# Patient Record
Sex: Male | Born: 1980 | Race: Asian | Hispanic: No | Marital: Married | Smoking: Former smoker
Health system: Southern US, Community
[De-identification: ages and names within clinical notes are randomized; demographics above are authoritative.]

## PROBLEM LIST (undated history)

## (undated) DIAGNOSIS — T7840XA Allergy, unspecified, initial encounter: Secondary | ICD-10-CM

## (undated) HISTORY — DX: Allergy, unspecified, initial encounter: T78.40XA

---

## 2020-11-08 ENCOUNTER — Ambulatory Visit: Payer: Self-pay | Admitting: Family Medicine

## 2020-12-16 ENCOUNTER — Other Ambulatory Visit: Payer: Self-pay

## 2020-12-16 ENCOUNTER — Ambulatory Visit (INDEPENDENT_AMBULATORY_CARE_PROVIDER_SITE_OTHER): Payer: Managed Care, Other (non HMO) | Admitting: Family Medicine

## 2020-12-16 ENCOUNTER — Encounter: Payer: Self-pay | Admitting: Family Medicine

## 2020-12-16 VITALS — BP 127/84 | HR 96 | Temp 98.2°F | Ht 65.25 in | Wt 149.0 lb

## 2020-12-16 DIAGNOSIS — J302 Other seasonal allergic rhinitis: Secondary | ICD-10-CM | POA: Diagnosis not present

## 2020-12-16 DIAGNOSIS — Z13220 Encounter for screening for lipoid disorders: Secondary | ICD-10-CM

## 2020-12-16 DIAGNOSIS — L649 Androgenic alopecia, unspecified: Secondary | ICD-10-CM

## 2020-12-16 DIAGNOSIS — Z131 Encounter for screening for diabetes mellitus: Secondary | ICD-10-CM | POA: Diagnosis not present

## 2020-12-16 DIAGNOSIS — Z0001 Encounter for general adult medical examination with abnormal findings: Secondary | ICD-10-CM | POA: Diagnosis not present

## 2020-12-16 NOTE — Patient Instructions (Signed)
It was very nice to see you today!  We will check blood work today.  Let me know if your allergies worsen.  I will see you back in a year or so.  Please come back to see me sooner if needed.  Take care, Dr [name]  Please try these tips to maintain a healthy lifestyle:   Eat at least 3 REAL meals and 1-2 snacks per day.  Aim for no more than 5 hours between eating.  If you eat breakfast, please do so within one hour of getting up.    Each meal should contain half fruits/vegetables, one quarter protein, and one quarter carbs (no bigger than a computer mouse)   Cut down on sweet beverages. This includes juice, soda, and sweet tea.     Drink at least 1 glass of water with each meal and aim for at least 8 glasses per day   Exercise at least 150 minutes every week.    Preventive Care 60-40 Years Old, Male Preventive care refers to lifestyle choices and visits with your health care provider that can promote health and wellness. This includes:  A yearly physical exam. This is also called an annual wellness visit.  Regular dental and eye exams.  Immunizations.  Screening for certain conditions.  Healthy lifestyle choices, such as: ? Eating a healthy diet. ? Getting regular exercise. ? Not using drugs or products that contain nicotine and tobacco. ? Limiting alcohol use. What can I expect for my preventive care visit? Physical exam Your health care provider will check your:  Height and weight. These may be used to calculate your BMI (body mass index). BMI is a measurement that tells if you are at a healthy weight.  Heart rate and blood pressure.  Body temperature.  Skin for abnormal spots. Counseling Your health care provider may ask you questions about your:  Past medical problems.  Family's medical history.  Alcohol, tobacco, and drug use.  Emotional well-being.  Home life and relationship well-being.  Sexual activity.  Diet, exercise, and sleep  habits.  Work and work Astronomer.  Access to firearms. What immunizations do I need? Vaccines are usually given at various ages, according to a schedule. Your health care provider will recommend vaccines for you based on your age, medical history, and lifestyle or other factors, such as travel or where you work.   What tests do I need? Blood tests  Lipid and cholesterol levels. These may be checked every 5 years, or more often if you are over 82 years old.  Hepatitis C test.  Hepatitis B test. Screening  Lung cancer screening. You may have this screening every year starting at age 64 if you have a 30-pack-year history of smoking and currently smoke or have quit within the past 15 years.  Prostate cancer screening. Recommendations will vary depending on your family history and other risks.  Genital exam to check for testicular cancer or hernias.  Colorectal cancer screening. ? All adults should have this screening starting at age 60 and continuing until age 11. ? Your health care provider may recommend screening at age 103 if you are at increased risk. ? You will have tests every 1-10 years, depending on your results and the type of screening test.  Diabetes screening. ? This is done by checking your blood sugar (glucose) after you have not eaten for a while (fasting). ? You may have this done every 1-3 years.  STD (sexually transmitted disease) testing, if you are  at risk. Follow these instructions at home: Eating and drinking  Eat a diet that includes fresh fruits and vegetables, whole grains, lean protein, and low-fat dairy products.  Take vitamin and mineral supplements as recommended by your health care provider.  Do not drink alcohol if your health care provider tells you not to drink.  If you drink alcohol: ? Limit how much you have to 0-2 drinks a day. ? Be aware of how much alcohol is in your drink. In the [place] one drink equals one 12 oz bottle of beer (355 mL),  one 5 oz glass of wine (148 mL), or one 1 oz glass of hard liquor (44 mL).   Lifestyle  Take daily care of your teeth and gums. Brush your teeth every morning and night with fluoride toothpaste. Floss one time each day.  Stay active. Exercise for at least 30 minutes 5 or more days each week.  Do not use any products that contain nicotine or tobacco, such as cigarettes, e-cigarettes, and chewing tobacco. If you need help quitting, ask your health care provider.  Do not use drugs.  If you are sexually active, practice safe sex. Use a condom or other form of protection to prevent STIs (sexually transmitted infections).  If told by your health care provider, take low-dose aspirin daily starting at age 23.  Find healthy ways to cope with stress, such as: ? Meditation, yoga, or listening to music. ? Journaling. ? Talking to a trusted person. ? Spending time with friends and family. Safety  Always wear your seat belt while driving or riding in a vehicle.  Do not drive: ? If you have been drinking alcohol. Do not ride with someone who has been drinking. ? When you are tired or distracted. ? While texting.  Wear a helmet and other protective equipment during sports activities.  If you have firearms in your house, make sure you follow all gun safety procedures. What's next?  Go to your health care provider once a year for an annual wellness visit.  Ask your health care provider how often you should have your eyes and teeth checked.  Stay up to date on all vaccines. This information is not intended to replace advice given to you by your health care provider. Make sure you discuss any questions you have with your health care provider. Document Revised: 07/18/2019 Document Reviewed: 10/13/2018 Elsevier Patient Education  2021 [employer].

## 2020-12-16 NOTE — Assessment & Plan Note (Signed)
Stable Continue claritin as needed.

## 2020-12-16 NOTE — Assessment & Plan Note (Signed)
Can use OTC minoxidil as needed. Discussed finasteride however he deferred at this time.

## 2020-12-16 NOTE — Progress Notes (Signed)
Chief Complaint:  David Wolf is a 40 y.o. male who presents today for his annual comprehensive physical exam.  He is a new patient.   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 Discussed finasteride however he deferred at this time.   Seasonal allergies Stable Continue claritin as needed.   Preventative Healthcare: Check lipids and glucose.  Up-to-date on vaccines.  Patient Counseling(The following topics were reviewed and/or handout was given):  -Nutrition: Stressed importance of moderation in sodium/caffeine intake, saturated fat and cholesterol, caloric balance, sufficient intake of fresh fruits, vegetables, and fiber.  -Stressed the importance of regular exercise.   -Substance Abuse: Discussed cessation/primary prevention of tobacco, alcohol, or other drug use; driving or other dangerous activities under the influence; availability of treatment for abuse.   -Injury prevention: Discussed safety belts, safety helmets, smoke detector, smoking near bedding or upholstery.   -Sexuality: Discussed sexually transmitted diseases, partner selection, use of condoms, avoidance of unintended pregnancy and contraceptive alternatives.   -Dental health: Discussed importance of regular tooth brushing, flossing, and dental visits.  -Health maintenance and immunizations reviewed. Please refer to Health maintenance section.  Return to care in 1 year for next preventative visit.     Subjective:  HPI:  He has no acute complaints today.   Lifestyle Diet: Balanced.  Exercise: Yoga.   Depression screen PHQ 2/9 12/16/2020  Decreased Interest 0  Down, Depressed, Hopeless 0  PHQ - 2 Score 0    Health Maintenance Due  Topic Date Due  . Hepatitis C Screening  Never done  . HIV Screening  Never done  . TETANUS/TDAP  Never done     ROS: Per HPI, otherwise a complete review of systems was negative.   PMH:  The following  were reviewed and entered/updated in epic: Past Medical History:  Diagnosis Date  . Allergy    Patient Active Problem List   Diagnosis Date Noted  . Seasonal allergies 12/16/2020  . 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 No pertinent surgical history.  Family History  Problem Relation Age of Onset  . Hypertension Father   . Diabetes Maternal Grandmother   . Hypertension Paternal Grandmother     Medications- reviewed and updated Current Outpatient Medications  Medication Sig Dispense Refill  . Loratadine (CLARITIN PO) Take by mouth.     No current facility-administered medications for this visit.    Allergies-reviewed and updated No Known Allergies  Social History   Socioeconomic History  . Marital status: Not on file    Spouse name: Not on file  . Number of children: Not on file  . Years of education: Not on file  . Highest education level: Not on file  Occupational History  . Not on file  Tobacco Use  . Smoking status: Former Games developer  . Smokeless tobacco: Not on file  Substance and Sexual Activity  . Alcohol use: Yes    Comment: occ  . Drug use: Never  . Sexual activity: Not on file  Other Topics Concern  . Not on file  Social History Narrative  . Not on file   Social Determinants of Health   Financial Resource Strain: Not on file  Food Insecurity: Not on file  Transportation Needs: Not on file  Physical Activity: Not on file  Stress: Not on file  Social Connections: Not on file        Objective:  Physical Exam: BP 127/84   Pulse 96   Temp  98.2 F (36.8 C) (Temporal)   Ht 5' 5.25" (1.657 m)   Wt 149 lb (67.6 kg)   SpO2 98%   BMI 24.61 kg/m   Body mass index is 24.61 kg/m. Wt Readings from Last 3 Encounters:  12/16/20 149 lb (67.6 kg)   Gen: NAD, resting comfortably HEENT: TMs normal bilaterally. OP clear. No thyromegaly noted.  CV: RRR with no murmurs appreciated Pulm: NWOB, CTAB with no crackles, wheezes, or rhonchi GI:  Normal bowel sounds present. Soft, Nontender, Nondistended. MSK: no edema, cyanosis, or clubbing noted Skin: warm, dry Neuro: CN2-12 grossly intact. Strength 5/5 in upper and lower extremities. Reflexes symmetric and intact bilaterally.  Psych: Normal affect and thought content     Shavonda Wiedman M. [name], MD 12/16/2020 2:53 PM

## 2020-12-17 ENCOUNTER — Encounter: Payer: Self-pay | Admitting: Family Medicine

## 2020-12-17 DIAGNOSIS — R739 Hyperglycemia, unspecified: Secondary | ICD-10-CM | POA: Insufficient documentation

## 2020-12-17 DIAGNOSIS — E785 Hyperlipidemia, unspecified: Secondary | ICD-10-CM | POA: Insufficient documentation

## 2020-12-17 LAB — LIPID PANEL
Cholesterol: 235 mg/dL — ABNORMAL HIGH (ref 0–200)
HDL: 37.4 mg/dL — ABNORMAL LOW (ref >39.00)
NonHDL: 197.44
Total CHOL/HDL Ratio: 6
Triglycerides: 353 mg/dL — ABNORMAL HIGH (ref 0.0–149.0)
VLDL: 70.6 mg/dL — ABNORMAL HIGH (ref 0.0–40.0)

## 2020-12-17 LAB — LDL CHOLESTEROL, DIRECT: Direct LDL: 176 mg/dL

## 2020-12-17 LAB — GLUCOSE, RANDOM: Glucose, Bld: 113 mg/dL — ABNORMAL HIGH (ref 70–99)

## 2020-12-17 NOTE — Progress Notes (Signed)
Please inform patient of the following:  Cholesterol and blood sugar are high. Do not need to start medications but he should work on diet and exercise and we can recheck in a year or so.  David Wolf. [name], MD 12/17/2020 12:57 PM

## 2021-05-08 ENCOUNTER — Ambulatory Visit (HOSPITAL_BASED_OUTPATIENT_CLINIC_OR_DEPARTMENT_OTHER)
Admission: RE | Admit: 2021-05-08 | Discharge: 2021-05-08 | Disposition: A | Discharge status: Home / Self Care | Payer: BC Managed Care – PPO | Source: Ambulatory Visit | Attending: Family | Admitting: Family

## 2021-05-08 ENCOUNTER — Ambulatory Visit (INDEPENDENT_AMBULATORY_CARE_PROVIDER_SITE_OTHER): Payer: BC Managed Care – PPO | Admitting: Family

## 2021-05-08 ENCOUNTER — Encounter: Payer: Self-pay | Admitting: Family

## 2021-05-08 ENCOUNTER — Other Ambulatory Visit: Payer: Self-pay

## 2021-05-08 VITALS — BP 118/70 | HR 85 | Temp 97.6°F | Ht 65.25 in | Wt 156.0 lb

## 2021-05-08 DIAGNOSIS — K2100 Gastro-esophageal reflux disease with esophagitis, without bleeding: Secondary | ICD-10-CM

## 2021-05-08 DIAGNOSIS — M25512 Pain in left shoulder: Secondary | ICD-10-CM | POA: Insufficient documentation

## 2021-05-08 DIAGNOSIS — L239 Allergic contact dermatitis, unspecified cause: Secondary | ICD-10-CM

## 2021-05-08 MED ORDER — TRIAMCINOLONE ACETONIDE 0.1 % EX CREA: 1.0000 "application " | TOPICAL_CREAM | Freq: Two times a day (BID) | CUTANEOUS | 0 refills | Status: AC

## 2021-05-08 MED ORDER — OMEPRAZOLE 20 MG PO CPDR: 20.0000 mg | DELAYED_RELEASE_CAPSULE | Freq: Every day | ORAL | 3 refills | Status: DC

## 2021-05-08 NOTE — Progress Notes (Signed)
Acute Office Visit  Subjective:    Patient ID: David Wolf, male    DOB: [birth year], 40 y.o.   MRN: [id number]  Chief Complaint  Patient presents with  . Rash    Pt c/o rash on right forearm area and spreading some on left forearm too, itching used cere vae cream.  . Shoulder Pain    Pt c/o left shoulder pain x 1 month, states old injury 2016. Pain is getting worse. Using Advil little relief.  . Gastroesophageal Reflux    Pt c/o acid reflux x 2 weeks, using Pep acid with relief.     HPI Patient is in today with complaints of a rash to his right forearm has been present about 1 week.  Reports taking a dose of Claritin has not helped much.  Denies any changes in detergents, soaps, or lotions.  No new environmental changes that he is aware of.  Patient reports left shoulder pain that has been ongoing x1 month.  Reports he had an old injury in 2016 where he lifted his nephew and the shoulder has always had some discomfort.  More recently, he has had pain that it typically is worse at night when he is laying on his left shoulder.  Has taken Advil once that seems to make reflux worse.  The pain is a 6 out of 10 and comes and goes.  Patient has concerns of heartburn and indigestion that has been going on the last 2 to 3 weeks.  He has been taking Pepcid that helps some.  He has also asked his mom to not make the food as spicy.  He typically consumes an [place] diet that tends to be higher in spice.  Reports having indigestion that tends to be worse at night.  He has an acidic taste in his mouth at times.  Denies any nausea vomiting, or blood in his stools.  Past Medical History:  Diagnosis Date  . Allergy     History reviewed. No pertinent surgical history.  Family History  Problem Relation Age of Onset  . Hypertension Father   . Diabetes Maternal Grandmother   . Hypertension Paternal Grandmother     Social History   Socioeconomic History  . Marital status: Married     Spouse name: Not on file  . Number of children: Not on file  . Years of education: Not on file  . Highest education level: Not on file  Occupational History  . Not on file  Tobacco Use  . Smoking status: Former    Pack years: 0.00  . Smokeless tobacco: Never  Substance and Sexual Activity  . Alcohol use: Yes    Comment: occ  . Drug use: Never  . Sexual activity: Not on file  Other Topics Concern  . Not on file  Social History Narrative  . Not on file   Social Determinants of Health   Financial Resource Strain: Not on file  Food Insecurity: Not on file  Transportation Needs: Not on file  Physical Activity: Not on file  Stress: Not on file  Social Connections: Not on file  Intimate Partner Violence: Not on file    Outpatient Medications Prior to Visit  Medication Sig Dispense Refill  . Loratadine (CLARITIN PO) Take by mouth.     No facility-administered medications prior to visit.    No Known Allergies  Review of Systems  Constitutional: Negative.   HENT: Negative.    Respiratory: Negative.    Cardiovascular: Negative.  Gastrointestinal:  Negative for blood in stool, constipation and diarrhea.       Heartburn and indigestion that is worse at night  Endocrine: Negative.   Musculoskeletal:  Positive for arthralgias.       Left shoulder pain  Skin:  Positive for rash.       Rash to the right forearm and mild rash to the left forearm  Allergic/Immunologic: Negative.   Neurological: Negative.  Negative for weakness and numbness.  Hematological: Negative.   Psychiatric/Behavioral: Negative.    All other systems reviewed and are negative.     Objective:    Physical Exam Vitals and nursing note reviewed.  Constitutional:      Appearance: Normal appearance. He is normal weight.  HENT:     Mouth/Throat:     Mouth: Mucous membranes are moist.  Cardiovascular:     Rate and Rhythm: Normal rate and regular rhythm.     Pulses: Normal pulses.     Heart sounds:  Normal heart sounds.  Pulmonary:     Effort: Pulmonary effort is normal.     Breath sounds: Normal breath sounds.  Abdominal:     General: Abdomen is flat. Bowel sounds are normal.     Palpations: Abdomen is soft.     Tenderness: There is no abdominal tenderness. There is no guarding or rebound.  Musculoskeletal:        General: Normal range of motion.     Right shoulder: Normal.     Left shoulder: No swelling, deformity or tenderness. Normal range of motion. Normal strength.       Arms:     Cervical back: Normal range of motion and neck supple.  Skin:    General: Skin is warm and dry.     Findings: Rash present.     Comments: Rash to the right and left forearm is a fine, papular, mildly excoriated rash.  Neurological:     General: No focal deficit present.     Mental Status: He is alert and oriented to person, place, and time.  Psychiatric:        Mood and Affect: Mood normal.        Behavior: Behavior normal.   BP 118/70 (BP Location: Left Arm, Patient Position: Sitting, Cuff Size: Normal)   Pulse 85   Temp 97.6 F (36.4 C) (Temporal)   Ht 5' 5.25" (1.657 m)   Wt 156 lb (70.8 kg)   SpO2 97%   BMI 25.76 kg/m  Wt Readings from Last 3 Encounters:  05/08/21 156 lb (70.8 kg)  12/16/20 149 lb (67.6 kg)    Health Maintenance Due  Topic Date Due  . HIV Screening  Never done  . Hepatitis C Screening  Never done  . TETANUS/TDAP  Never done  . COVID-19 Vaccine (3 - Booster for Pfizer series) 01/11/2021    There are no preventive care reminders to display for this patient.   No results found for: TSH No results found for: WBC, HGB, HCT, MCV, PLT Lab Results  Component Value Date   GLUCOSE 113 (H) 12/16/2020   Lab Results  Component Value Date   CHOL 235 (H) 12/16/2020   Lab Results  Component Value Date   HDL 37.40 (L) 12/16/2020   No results found for: [facility] Lab Results  Component Value Date   TRIG 353.0 (H) 12/16/2020   Lab Results  Component Value  Date   CHOLHDL 6 12/16/2020   No results found for: HGBA1C  Assessment & Plan:   Problem List Items Addressed This Visit   None Visit Diagnoses     Gastroesophageal reflux disease with esophagitis without hemorrhage    -  Primary   Allergic contact dermatitis, unspecified trigger       Acute pain of left shoulder       Relevant Orders   DG Shoulder Left        Meds ordered this encounter  Medications  . omeprazole (PRILOSEC) 20 MG capsule    Sig: Take 1 capsule (20 mg total) by mouth daily.    Dispense:  30 capsule    Refill:  3  . triamcinolone cream (KENALOG) 0.1 %    Sig: Apply 1 application topically 2 (two) times daily.    Dispense:  30 g    Refill:  0     Eulis Foster, FNP

## 2021-05-19 ENCOUNTER — Telehealth: Payer: Self-pay | Admitting: *Deleted

## 2021-05-19 NOTE — Telephone Encounter (Signed)
Pt called and said x-ray shows arthritis but wants to know what he is suppose to do for Left shoulder pain medication, exercises or what? Told him I will send message to [facility] and get back to him. Pt verbalized understanding.

## 2021-05-19 NOTE — Telephone Encounter (Signed)
David Wolf, pt wants to know what he is suppose to do for arthritis of left shoulder? Medication, exercises, therapy? Please advise.

## 2021-05-20 NOTE — Telephone Encounter (Signed)
Spoke to pt told him per [facility], may take anti-inflammatory medications as needed (Ibuprofen, Advil, Aleve). Strengthening exercises to strengthen the muscles around the shoulder, warm compresses. If symptoms persist, we can refer to ortho. Pt verbalized understanding.

## 2021-09-24 ENCOUNTER — Ambulatory Visit (INDEPENDENT_AMBULATORY_CARE_PROVIDER_SITE_OTHER): Payer: BC Managed Care – PPO

## 2021-09-24 ENCOUNTER — Other Ambulatory Visit: Payer: Self-pay

## 2021-09-24 DIAGNOSIS — Z23 Encounter for immunization: Secondary | ICD-10-CM | POA: Diagnosis not present

## 2021-10-04 ENCOUNTER — Other Ambulatory Visit: Payer: Self-pay | Admitting: Family

## 2022-05-22 ENCOUNTER — Encounter: Payer: Self-pay | Admitting: Family Medicine

## 2022-05-22 ENCOUNTER — Ambulatory Visit (INDEPENDENT_AMBULATORY_CARE_PROVIDER_SITE_OTHER): Payer: BC Managed Care – PPO | Admitting: Family Medicine

## 2022-05-22 VITALS — BP 129/87 | HR 80 | Temp 98.0°F | Ht 65.25 in | Wt 156.6 lb

## 2022-05-22 DIAGNOSIS — E663 Overweight: Secondary | ICD-10-CM | POA: Diagnosis not present

## 2022-05-22 DIAGNOSIS — Z0001 Encounter for general adult medical examination with abnormal findings: Secondary | ICD-10-CM

## 2022-05-22 DIAGNOSIS — E785 Hyperlipidemia, unspecified: Secondary | ICD-10-CM | POA: Diagnosis not present

## 2022-05-22 DIAGNOSIS — R739 Hyperglycemia, unspecified: Secondary | ICD-10-CM | POA: Diagnosis not present

## 2022-05-22 LAB — COMPREHENSIVE METABOLIC PANEL
ALT: 44 U/L (ref 0–53)
AST: 23 U/L (ref 0–37)
Albumin: 4.5 g/dL (ref 3.5–5.2)
Alkaline Phosphatase: 68 U/L (ref 39–117)
BUN: 9 mg/dL (ref 6–23)
CO2: 28 mEq/L (ref 19–32)
Calcium: 9.2 mg/dL (ref 8.4–10.5)
Chloride: 103 mEq/L (ref 96–112)
Creatinine, Ser: 0.78 mg/dL (ref 0.40–1.50)
GFR: 109.96 mL/min (ref >60.00)
Glucose, Bld: 153 mg/dL — ABNORMAL HIGH (ref 70–99)
Potassium: 4.2 mEq/L (ref 3.5–5.1)
Sodium: 139 mEq/L (ref 135–145)
Total Bilirubin: 0.5 mg/dL (ref 0.2–1.2)
Total Protein: 6.8 g/dL (ref 6.0–8.3)

## 2022-05-22 LAB — LIPID PANEL
Cholesterol: 232 mg/dL — ABNORMAL HIGH (ref 0–200)
HDL: 39 mg/dL — ABNORMAL LOW (ref >39.00)
LDL Cholesterol: 157 mg/dL — ABNORMAL HIGH (ref 0–99)
NonHDL: 193.11
Total CHOL/HDL Ratio: 6
Triglycerides: 183 mg/dL — ABNORMAL HIGH (ref 0.0–149.0)
VLDL: 36.6 mg/dL (ref 0.0–40.0)

## 2022-05-22 LAB — HEMOGLOBIN A1C: Hgb A1c MFr Bld: 7.8 % — ABNORMAL HIGH (ref 4.6–6.5)

## 2022-05-22 LAB — CBC
HCT: 41.9 % (ref 39.0–52.0)
Hemoglobin: 14.1 g/dL (ref 13.0–17.0)
MCHC: 33.7 g/dL (ref 30.0–36.0)
MCV: 80.4 fl (ref 78.0–100.0)
Platelets: 237 10*3/uL (ref 150.0–400.0)
RBC: 5.21 Mil/uL (ref 4.22–5.81)
RDW: 12.6 % (ref 11.5–15.5)
WBC: 5.9 10*3/uL (ref 4.0–10.5)

## 2022-05-22 LAB — TSH: TSH: 2.15 u[IU]/mL (ref 0.35–5.50)

## 2022-05-22 NOTE — Patient Instructions (Signed)
It was very nice to see you today!  We will check blood work today.  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  You can try using compression sleeves.  You can also use Tylenol as needed.  You will be due for your next physical in 1 year.  Take care, Dr [name]  PLEASE NOTE:  If you had any lab tests please let us know if you have not heard back within a few days. You may see your results on mychart before we have a chance to review them but we will give you a call once they are reviewed by [place]. If we ordered any referrals today, please let us know if you have not heard from their office within the next week.   Please try these tips to maintain a healthy lifestyle:  Eat at least 3 REAL meals and 1-2 snacks per day.  Aim for no more than 5 hours between eating.  If you eat breakfast, please do so within one hour of getting up.   Each meal should contain half fruits/vegetables, one quarter protein, and one quarter carbs (no bigger than a computer mouse)  Cut down on sweet beverages. This includes juice, soda, and sweet tea.   Drink at least 1 glass of water with each meal and aim for at least 8 glasses per day  Exercise at least 150 minutes every week.    Preventive Care 49-91 Years Old, Male Preventive care refers to lifestyle choices and visits with your health care provider that can promote health and wellness. Preventive care visits are also called wellness exams. What can I expect for my preventive care visit? Counseling During your preventive care visit, your health care provider may ask about your: Medical history, including: Past medical problems. Family medical history. Current health, including: Emotional well-being. Home life and relationship well-being. Sexual activity. Lifestyle, including: Alcohol, nicotine or tobacco, and drug use. Access to firearms. Diet, exercise, and sleep habits. Safety issues such as seatbelt and bike helmet use. Sunscreen  use. Work and work Astronomer. Physical exam Your health care provider will check your: Height and weight. These may be used to calculate your BMI (body mass index). BMI is a measurement that tells if you are at a healthy weight. Waist circumference. This measures the distance around your waistline. This measurement also tells if you are at a healthy weight and may help predict your risk of certain diseases, such as type 2 diabetes and high blood pressure. Heart rate and blood pressure. Body temperature. Skin for abnormal spots. What immunizations do I need?  Vaccines are usually given at various ages, according to a schedule. Your health care provider will recommend vaccines for you based on your age, medical history, and lifestyle or other factors, such as travel or where you work. What tests do I need? Screening Your health care provider may recommend screening tests for certain conditions. This may include: Lipid and cholesterol levels. Diabetes screening. This is done by checking your blood sugar (glucose) after you have not eaten for a while (fasting). Hepatitis B test. Hepatitis C test. HIV (human immunodeficiency virus) test. STI (sexually transmitted infection) testing, if you are at risk. Lung cancer screening. Prostate cancer screening. Colorectal cancer screening. Talk with your health care provider about your test results, treatment options, and if necessary, the need for more tests. Follow these instructions at home: Eating and drinking  Eat a diet that includes fresh fruits and vegetables, whole grains, lean  protein, and low-fat dairy products. Take vitamin and mineral supplements as recommended by your health care provider. Do not drink alcohol if your health care provider tells you not to drink. If you drink alcohol: Limit how much you have to 0-2 drinks a day. Know how much alcohol is in your drink. In the [place] one drink equals one 12 oz bottle of beer (355 mL),  one 5 oz glass of wine (148 mL), or one 1 oz glass of hard liquor (44 mL). Lifestyle Brush your teeth every morning and night with fluoride toothpaste. Floss one time each day. Exercise for at least 30 minutes 5 or more days each week. Do not use any products that contain nicotine or tobacco. These products include cigarettes, chewing tobacco, and vaping devices, such as e-cigarettes. If you need help quitting, ask your health care provider. Do not use drugs. If you are sexually active, practice safe sex. Use a condom or other form of protection to prevent STIs. Take aspirin only as told by your health care provider. Make sure that you understand how much to take and what form to take. Work with your health care provider to find out whether it is safe and beneficial for you to take aspirin daily. Find healthy ways to manage stress, such as: Meditation, yoga, or listening to music. Journaling. Talking to a trusted person. Spending time with friends and family. Minimize exposure to UV radiation to reduce your risk of skin cancer. Safety Always wear your seat belt while driving or riding in a vehicle. Do not drive: If you have been drinking alcohol. Do not ride with someone who has been drinking. When you are tired or distracted. While texting. If you have been using any mind-altering substances or drugs. Wear a helmet and other protective equipment during sports activities. If you have firearms in your house, make sure you follow all gun safety procedures. What's next? Go to your health care provider once a year for an annual wellness visit. Ask your health care provider how often you should have your eyes and teeth checked. Stay up to date on all vaccines. This information is not intended to replace advice given to you by your health care provider. Make sure you discuss any questions you have with your health care provider. Document Revised: 04/16/2021 Document Reviewed:  04/16/2021 Elsevier Patient Education  [employer].

## 2022-05-22 NOTE — Assessment & Plan Note (Signed)
Discussed lifestyle modifications.  We will check labs including lipids today.

## 2022-05-22 NOTE — Assessment & Plan Note (Signed)
Discussed lifestyle modifications. Check A1c.  

## 2022-05-22 NOTE — Progress Notes (Signed)
Chief Complaint:  David Wolf is a 41 y.o. male who presents today for his annual comprehensive physical exam.    Assessment/Plan:  New/Acute Problems: Knee Pain No red flags.  Likely secondary to mild underlying osteoarthritis.  We discussed conservative management including over-the-counter medications and compression.  Chronic Problems Addressed Today: Hyperglycemia Discussed lifestyle modifications.  Check A1c  Dyslipidemia Discussed lifestyle modifications.  We will check labs including lipids today.   Body mass index is 25.86 kg/m. / Overweight  BMI Metric Follow Up - 05/22/22 0918       BMI Metric Follow Up-Please document annually   BMI Metric Follow Up Education provided             Preventative Healthcare: Check labs today.  Due for colonoscopy at age 69.  Patient Counseling(The following topics were reviewed and/or handout was given):  -Nutrition: Stressed importance of moderation in sodium/caffeine intake, saturated fat and cholesterol, caloric balance, sufficient intake of fresh fruits, vegetables, and fiber.  -Stressed the importance of regular exercise.   -Substance Abuse: Discussed cessation/primary prevention of tobacco, alcohol, or other drug use; driving or other dangerous activities under the influence; availability of treatment for abuse.   -Injury prevention: Discussed safety belts, safety helmets, smoke detector, smoking near bedding or upholstery.   -Sexuality: Discussed sexually transmitted diseases, partner selection, use of condoms, avoidance of unintended pregnancy and contraceptive alternatives.   -Dental health: Discussed importance of regular tooth brushing, flossing, and dental visits.  -Health maintenance and immunizations reviewed. Please refer to Health maintenance section.  Return to care in 1 year for next preventative visit.     Subjective:  HPI:  See A/P for status of chronic conditions.  He has been having  occasional knee pain.  This happens very infrequently when going up or down stairs.  He has been playing tennis which she thinks may have made his symptoms worse.  No specific treatment tried.  No injuries.  No other obvious precipitating events.  Lifestyle Diet: Cutting down on carbs. Trying to get more fruits and vegetables.  Exercise: Will be moving soon and is busy with packing. Walks about 30 minutes per day.      05/22/2022    8:42 AM  Depression screen PHQ 2/9  Decreased Interest 0  Down, Depressed, Hopeless 0  PHQ - 2 Score 0    Health Maintenance Due  Topic Date Due   HIV Screening  Never done   Hepatitis C Screening  Never done   COVID-19 Vaccine (3 - Pfizer series) 10/08/2020     ROS: Per HPI, otherwise a complete review of systems was negative.   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 No pertinent surgical history.  Family History  Problem Relation Age of Onset   Hypertension Father    Diabetes Maternal Grandmother    Hypertension Paternal Grandmother     Medications- reviewed and updated Current Outpatient Medications  Medication Sig Dispense Refill   Loratadine (CLARITIN PO) Take by mouth.     triamcinolone cream (KENALOG) 0.1 % Apply 1 application topically 2 (two) times daily. 30 g 0   No current facility-administered medications for this visit.    Allergies-reviewed and updated No Known Allergies  Social History  Socioeconomic History   Marital status: Married    Spouse name: Not on file   Number of children: Not on file   Years of education: Not on file   Highest education level: Not on file  Occupational History   Not on file  Tobacco Use   Smoking status: Former    Smokeless tobacco: Never  Substance and Sexual Activity   Alcohol use: Yes    Comment: occ   Drug use: Never   Sexual activity: Not on file  Other Topics Concern   Not on file  Social History Narrative   Not on file   Social Determinants of Health   Financial Resource Strain: Not on file  Food Insecurity: Not on file  Transportation Needs: Not on file  Physical Activity: Not on file  Stress: Not on file  Social Connections: Not on file        Objective:  Physical Exam: BP 129/87   Pulse 80   Temp 98 F (36.7 C) (Temporal)   Ht 5' 5.25" (1.657 m)   Wt 156 lb 9.6 oz (71 kg)   SpO2 98%   BMI 25.86 kg/m   Body mass index is 25.86 kg/m. Wt Readings from Last 3 Encounters:  05/22/22 156 lb 9.6 oz (71 kg)  05/08/21 156 lb (70.8 kg)  12/16/20 149 lb (67.6 kg)   Gen: NAD, resting comfortably HEENT: TMs normal bilaterally. OP clear. No thyromegaly noted.  CV: RRR with no murmurs appreciated Pulm: NWOB, CTAB with no crackles, wheezes, or rhonchi GI: Normal bowel sounds present. Soft, Nontender, Nondistended. MSK: no edema, cyanosis, or clubbing noted - Knee: No deformities or effusions.  Crepitus with active and passive range of motion.  Neurovascular intact distally Skin: warm, dry Neuro: CN2-12 grossly intact. Strength 5/5 in upper and lower extremities. Reflexes symmetric and intact bilaterally.  Psych: Normal affect and thought content     Lalena Salas M. [name], MD 05/22/2022 9:19 AM

## 2022-05-26 NOTE — Progress Notes (Signed)
Please inform patient of the following:  His A1c is significantly elevated and it is in the diabetic range.  We need to work on getting this lower to decrease his future risk of heart attack and stroke.  Recommend starting metformin XR 500 mg daily.  Please send information if he is agreeable to start.  He should continue to work on diet and exercise.  I would like to see him back in 3 months to recheck his A1c.  His cholesterol levels are also elevated.  We can discuss cholesterol medication when he comes back in for his  office visit.  He should continue to work on diet and exercise as above.  Everything else is normal.

## 2022-07-27 ENCOUNTER — Encounter: Payer: Self-pay | Admitting: *Deleted

## 2022-10-15 ENCOUNTER — Encounter: Payer: Self-pay | Admitting: *Deleted

## 2022-12-22 IMAGING — DX DG SHOULDER 2+V*L*
3 series · 3 of 3 positions shown · non-contrast
Comparison: None.

CLINICAL DATA: 40-year-old male with trauma to the left shoulder.

EXAM:
LEFT SHOULDER - 2+ VIEW

[shoulder grashey]
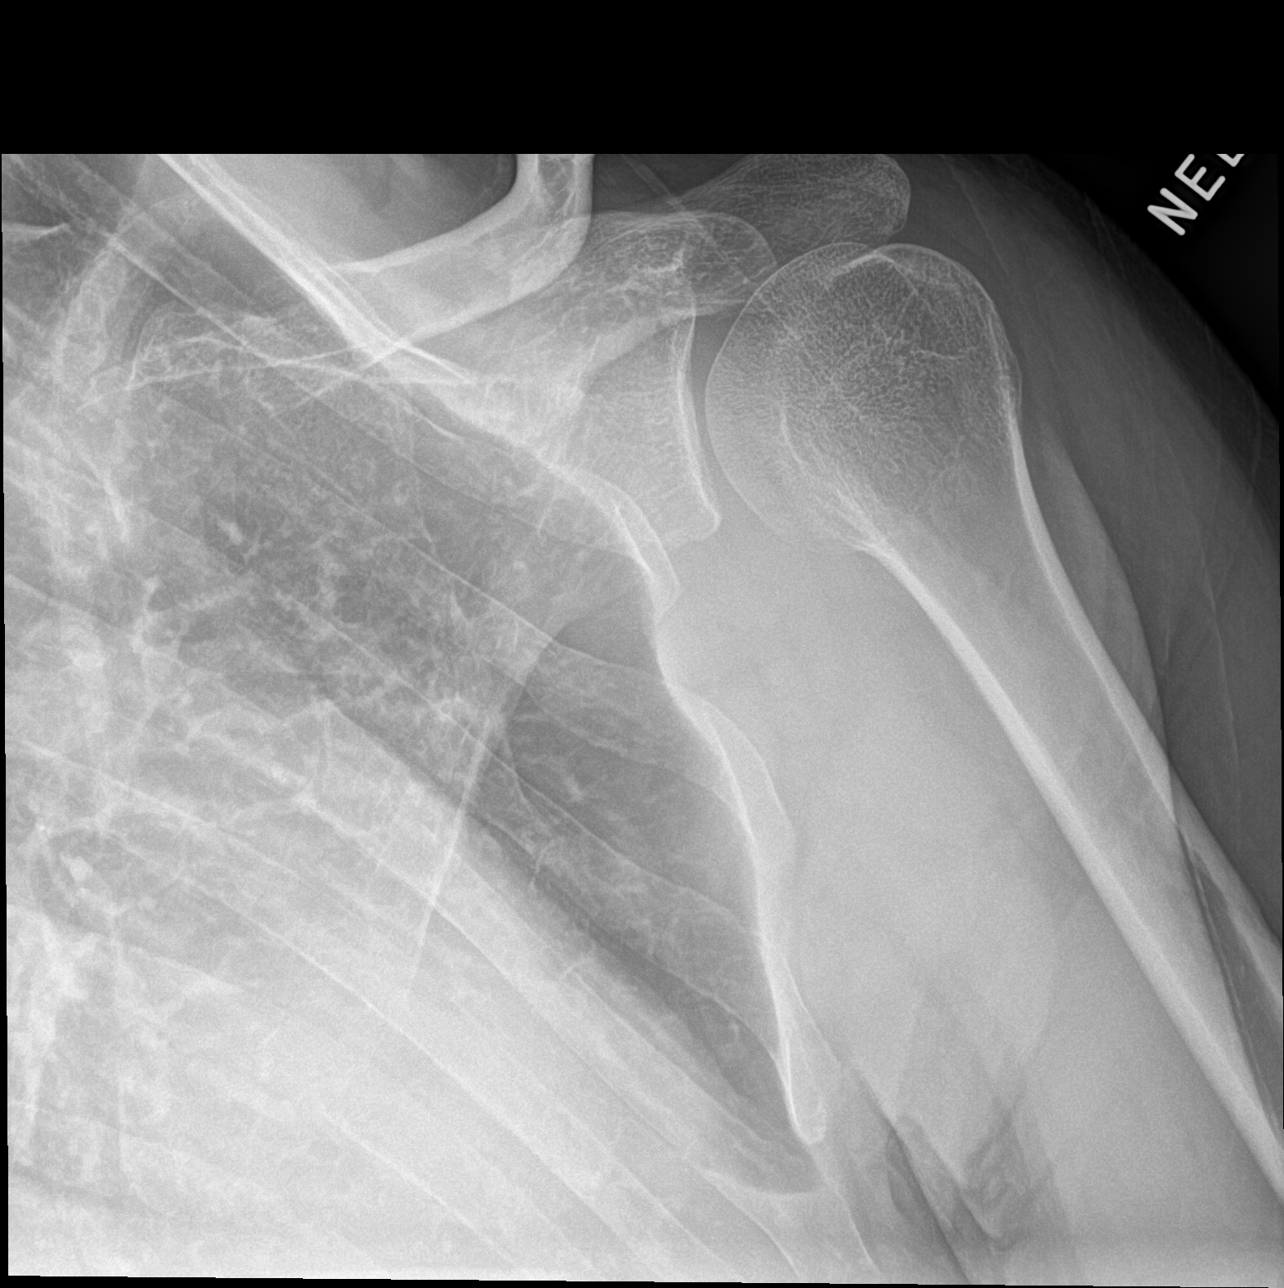

[shoulder y view]
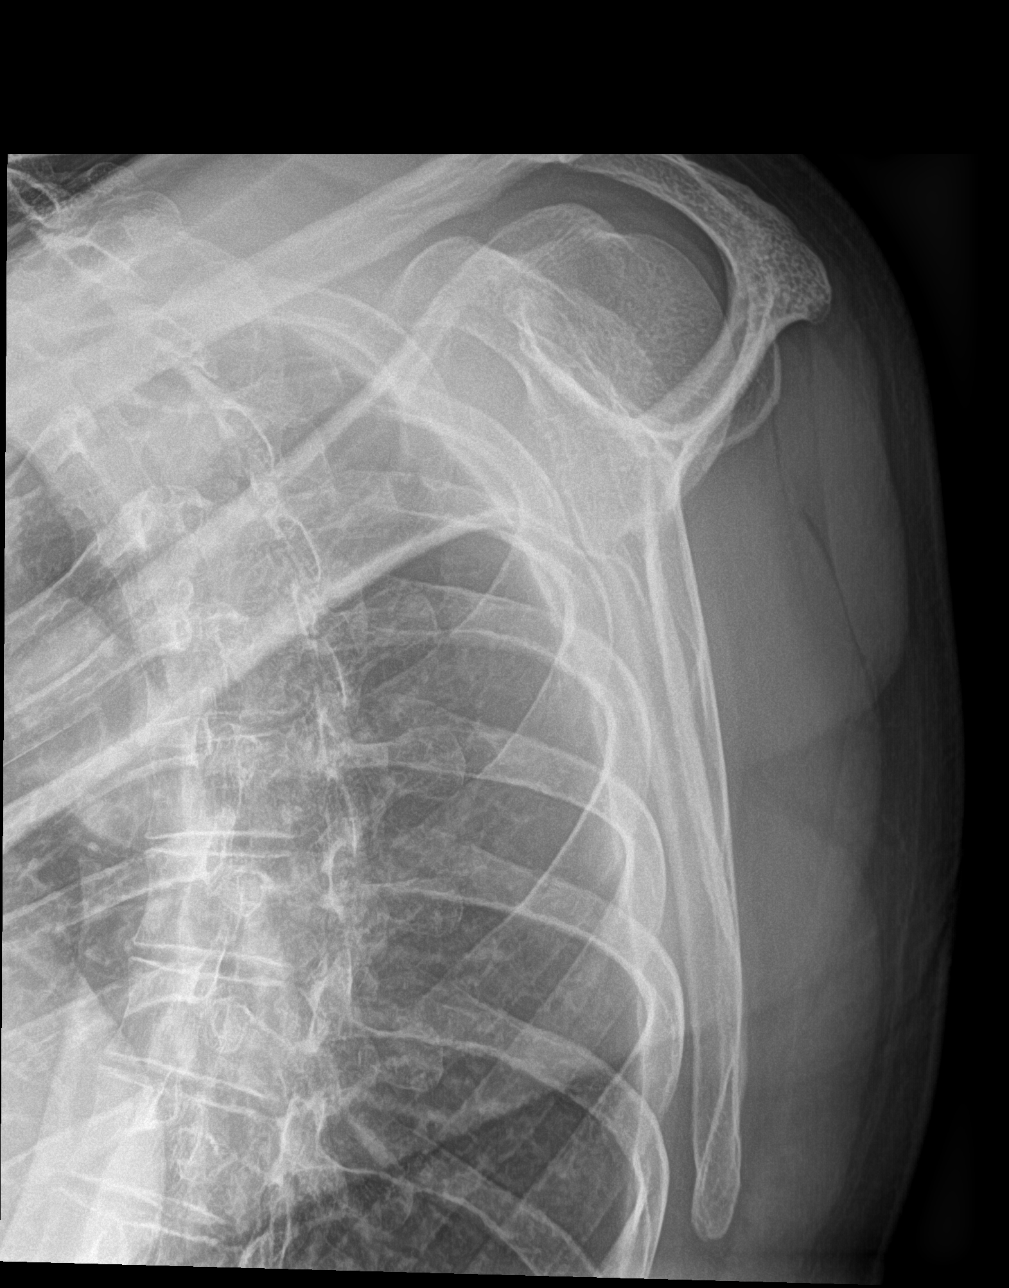

[shoulder axillary]
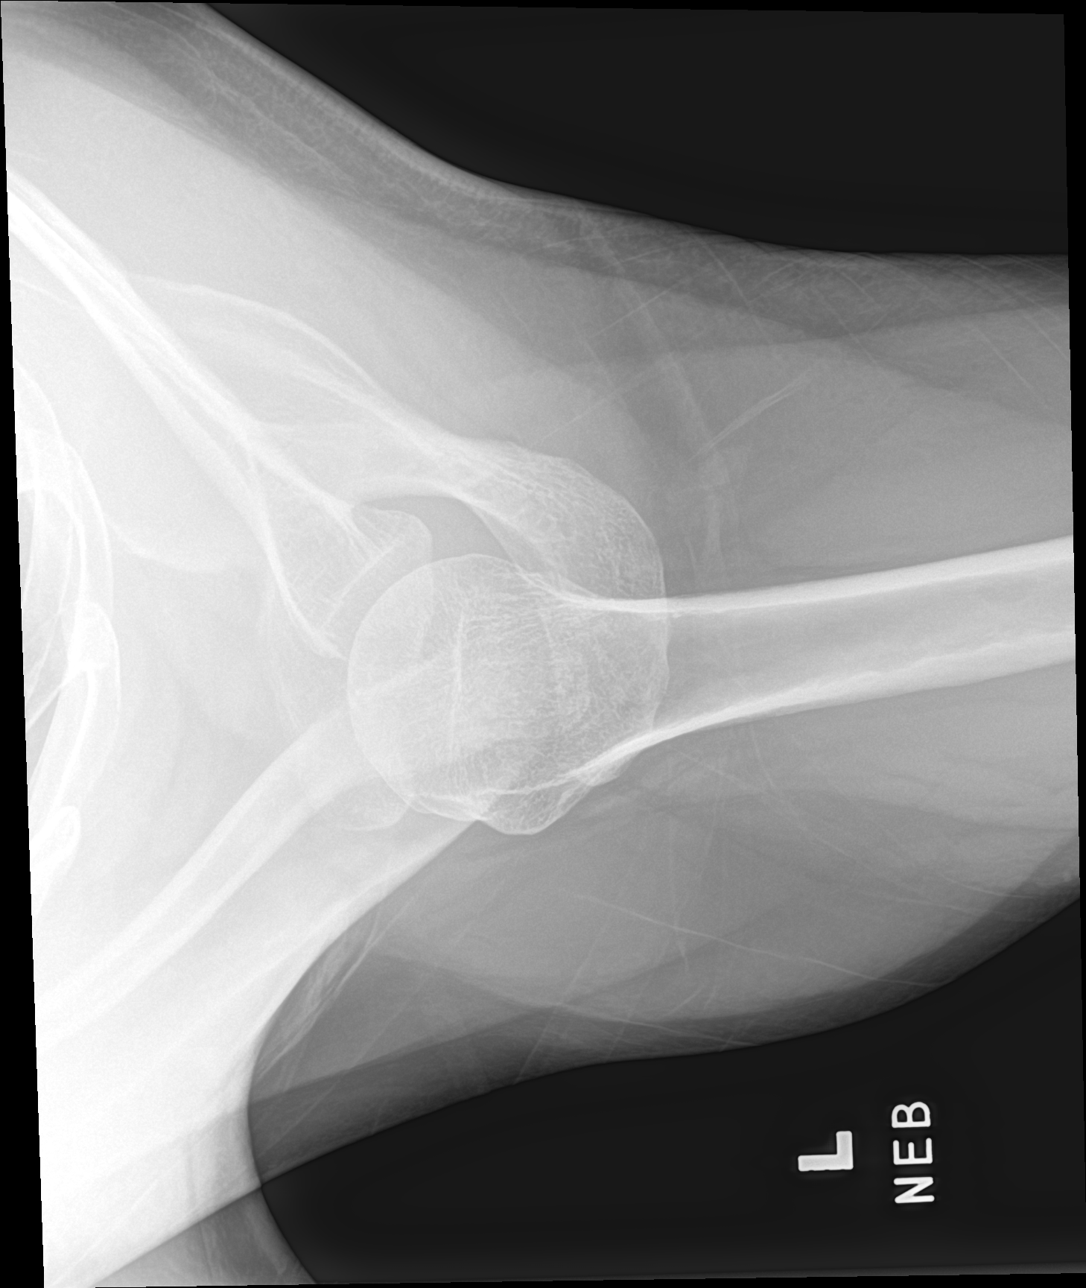

[3 of 3 positions shown; findings below may reference images not displayed]

FINDINGS: There is no acute fracture or dislocation. The bones are well
mineralized. Mild degenerative changes and narrowing of the
glenohumeral joint space. The soft tissues are unremarkable.
IMPRESSION: Negative.
